# Patient Record
Sex: Female | Born: 1980
Health system: Southern US, Community
[De-identification: ages and names within clinical notes are randomized; demographics above are authoritative.]

---

## 2001-08-19 ENCOUNTER — Emergency Department: Admission: EM | Admit: 2001-08-19 | Discharge: 2001-08-19 | Payer: Self-pay | Admitting: Emergency Medicine

## 2009-01-24 ENCOUNTER — Emergency Department (HOSPITAL_BASED_OUTPATIENT_CLINIC_OR_DEPARTMENT_OTHER): Admission: EM | Admit: 2009-01-24 | Discharge: 2009-01-24 | Payer: Self-pay | Admitting: Emergency Medicine

## 2009-01-24 ENCOUNTER — Ambulatory Visit: Payer: Self-pay | Admitting: Diagnostic Radiology
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
## 2020-12-18 ENCOUNTER — Other Ambulatory Visit (HOSPITAL_BASED_OUTPATIENT_CLINIC_OR_DEPARTMENT_OTHER): Payer: Self-pay | Admitting: Internal Medicine

## 2020-12-18 ENCOUNTER — Ambulatory Visit: Payer: Self-pay | Attending: Internal Medicine

## 2020-12-18 DIAGNOSIS — Z23 Encounter for immunization: Secondary | ICD-10-CM

## 2020-12-18 NOTE — Progress Notes (Signed)
   [facility]  Name:  Lynn Warner    MRN: [id number] DOB: 1981/06/14  12/18/2020  Lynn Warner was observed post Covid-19 immunization for 15 minutes without incident. She was provided with Vaccine Information Sheet and instruction to access the V-Safe system.   Lynn Warner was instructed to call 911 with any severe reactions post vaccine: Marland Kitchen Difficulty breathing  . Swelling of face and throat  . A fast heartbeat  . A bad rash all over body  . Dizziness and weakness   Immunizations Administered    Name Date Dose VIS Date Route   Moderna Covid-19 Booster Vaccine 12/18/2020  1:16 PM 0.25 mL 09/30/2020 Intramuscular   Manufacturer: Gala Murdoch   Lot: [id number]   NDC: 44818-563-14

## 2020-12-21 MED FILL — MODERNA COVID-19 VACCINE 10: 100 | 28 days supply | Qty: 0 | Fill #0
